# Patient Record
Sex: Female | Born: 1965 | Race: White | Hispanic: No | Smoking: Never smoker
Health system: Southern US, Community
[De-identification: ages and names within clinical notes are randomized; demographics above are authoritative.]

## PROBLEM LIST (undated history)

## (undated) HISTORY — PX: CHOLECYSTECTOMY: SHX55

---

## 2005-07-15 ENCOUNTER — Emergency Department: Payer: Self-pay | Admitting: Emergency Medicine

## 2005-07-15 ENCOUNTER — Ambulatory Visit: Payer: Self-pay | Admitting: Surgery

## 2005-07-15 ENCOUNTER — Ambulatory Visit: Payer: Self-pay | Admitting: Emergency Medicine

## 2009-03-31 ENCOUNTER — Emergency Department: Payer: Self-pay | Admitting: Emergency Medicine

## 2010-11-09 IMAGING — CR DG CHEST 2V
1 series · 2 of 2 positions shown · non-contrast
Comparison: none

REASON FOR EXAM: productive cough-flex6
COMMENTS:

[Series 1: view not recorded · 0.17mm/px · 2 of 2 slices shown]
[im 1/2]
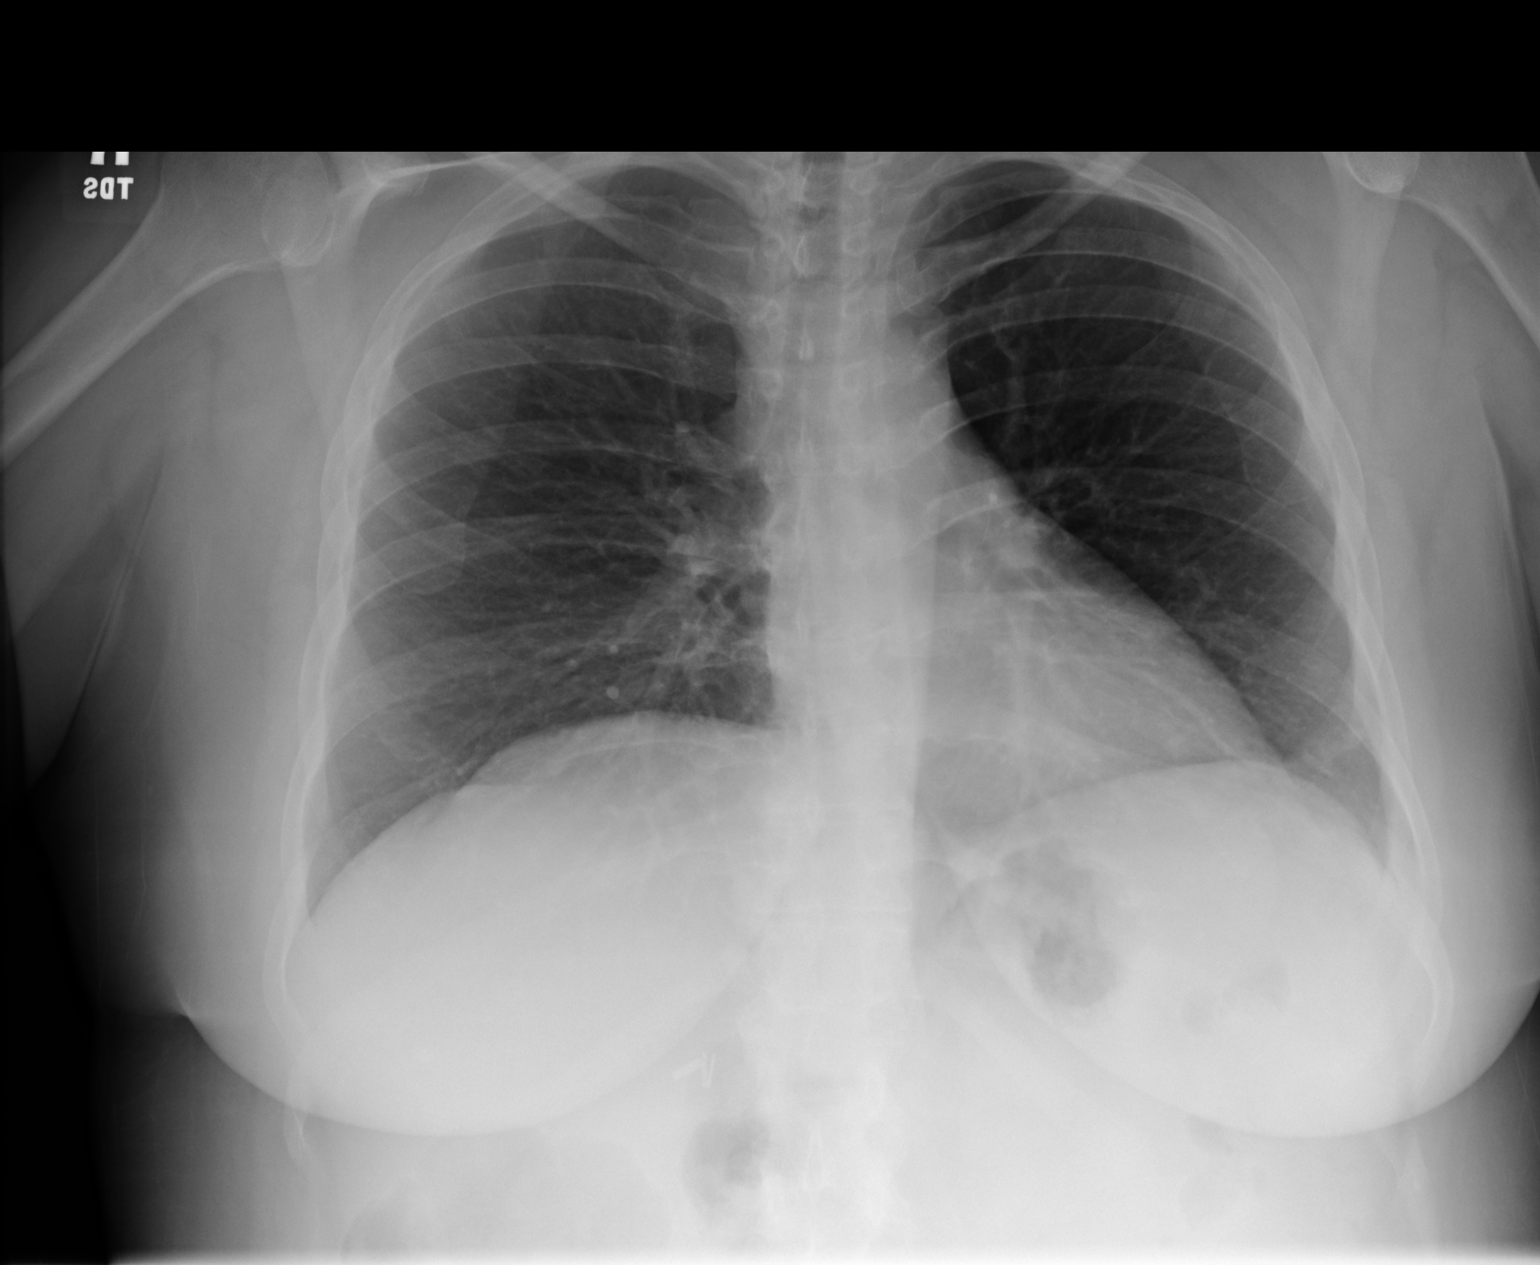
[im 2/2]
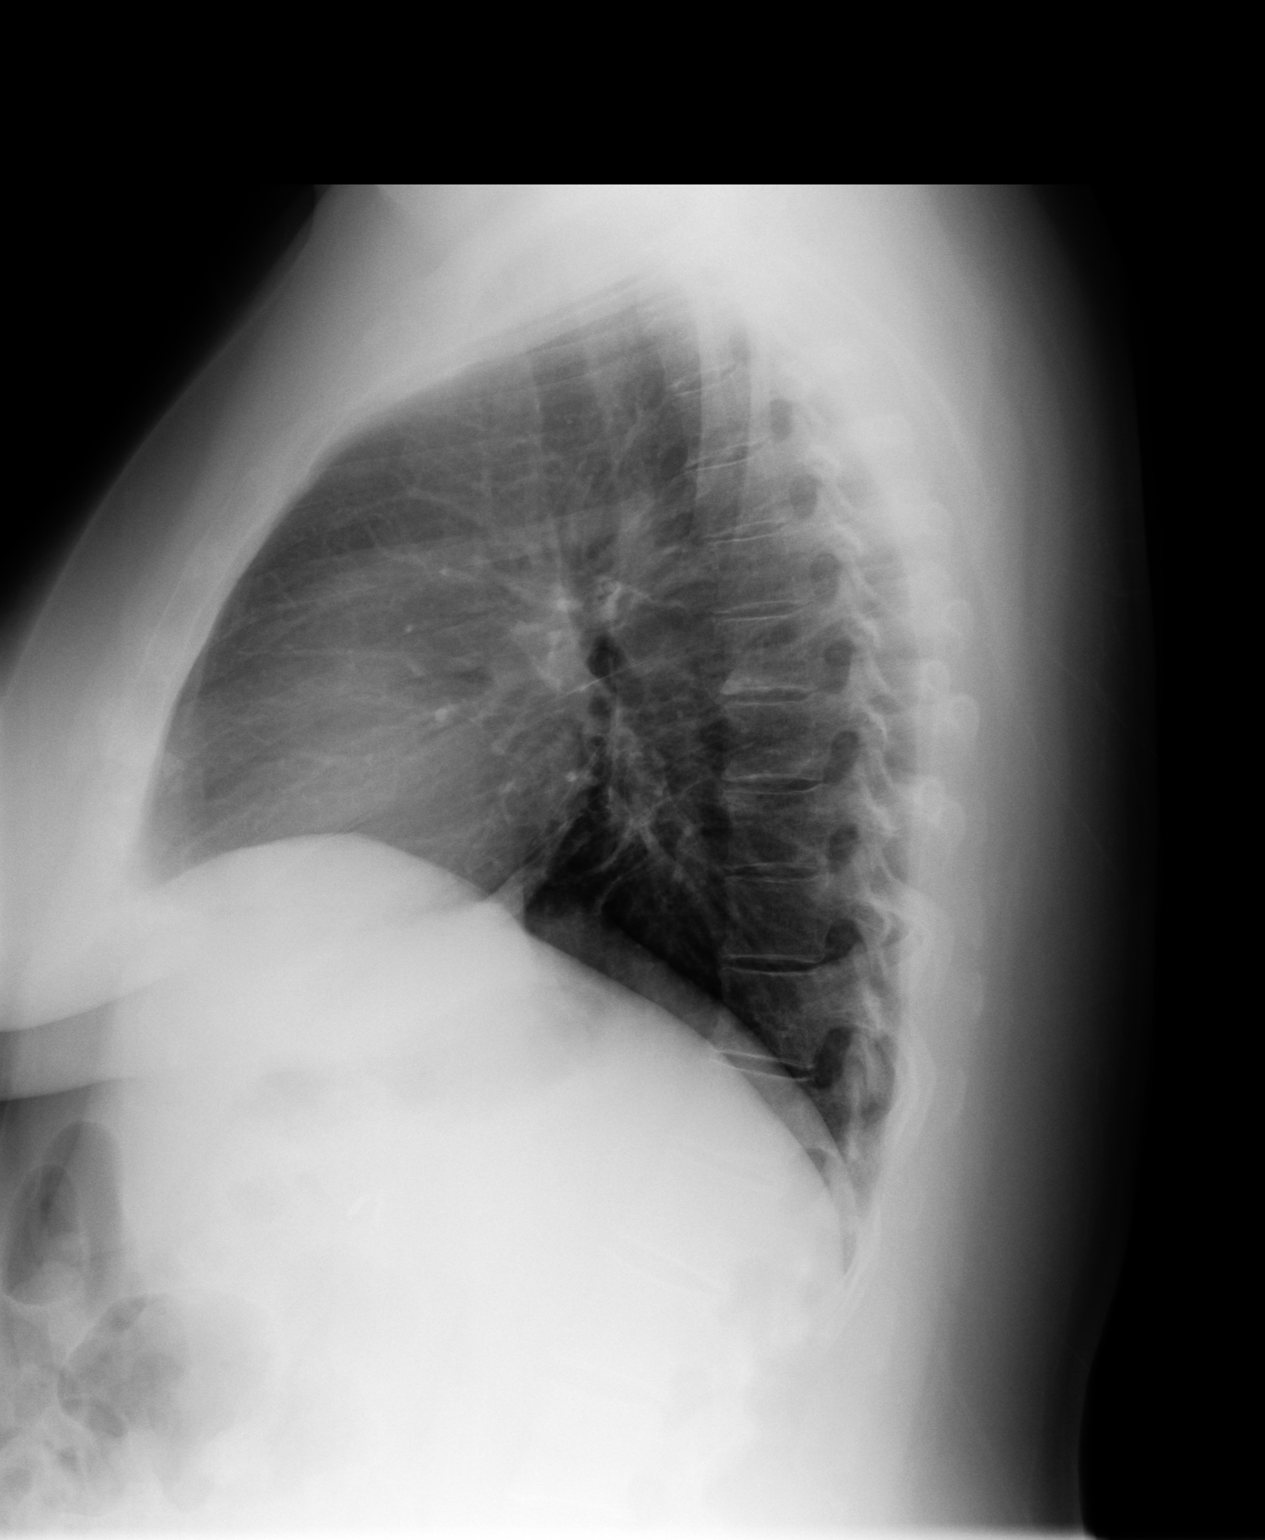

[2 of 2 positions shown; findings below may reference images not displayed]

PROCEDURE:     DXR - DXR CHEST PA (OR AP) AND LATERAL  - March 31, 2009 [DATE]

RESULT:     The lungs are mildly hypoinflated on the frontal film but are
better inflated on the lateral film. There is no focal pneumonia. The
cardiac silhouette is normal in size. The pulmonary vascularity does not
appear engorged.
IMPRESSION: I do not see evidence of pneumonia. I cannot exclude an
element of acute bronchitis in the appropriate clinical setting. Followup
films are recommended if the patient's cough persists despite therapy.

## 2013-07-02 LAB — CBC
HCT: 39.3 % (ref 35.0–47.0)
MCH: 26.6 pg (ref 26.0–34.0)
MCV: 78 fL — ABNORMAL LOW (ref 80–100)
Platelet: 193 10*3/uL (ref 150–440)
WBC: 10.5 10*3/uL (ref 3.6–11.0)

## 2013-07-02 LAB — COMPREHENSIVE METABOLIC PANEL
Alkaline Phosphatase: 76 U/L (ref 50–136)
Calcium, Total: 9.3 mg/dL (ref 8.5–10.1)
Chloride: 105 mmol/L (ref 98–107)
Co2: 27 mmol/L (ref 21–32)
Creatinine: 0.65 mg/dL (ref 0.60–1.30)
EGFR (African American): >60
EGFR (Non-African Amer.): >60
Glucose: 149 mg/dL — ABNORMAL HIGH (ref 65–99)
Osmolality: 276 (ref 275–301)
SGOT(AST): 48 U/L — ABNORMAL HIGH (ref 15–37)
Sodium: 137 mmol/L (ref 136–145)
Total Protein: 7.5 g/dL (ref 6.4–8.2)

## 2013-07-02 LAB — URINALYSIS, COMPLETE
Bilirubin,UR: NEGATIVE
Blood: NEGATIVE
Glucose,UR: NEGATIVE mg/dL (ref 0–75)
Ph: 5 (ref 4.5–8.0)
Protein: 30
Squamous Epithelial: 3

## 2013-07-02 LAB — DRUG SCREEN, URINE
Amphetamines, Ur Screen: NEGATIVE (ref ?–1000)
Barbiturates, Ur Screen: NEGATIVE (ref ?–200)
Cocaine Metabolite,Ur ~~LOC~~: NEGATIVE (ref ?–300)
MDMA (Ecstasy)Ur Screen: NEGATIVE (ref ?–500)
Methadone, Ur Screen: NEGATIVE (ref ?–300)
Opiate, Ur Screen: NEGATIVE (ref ?–300)

## 2013-07-02 LAB — ETHANOL: Ethanol: <3 mg/dL

## 2013-07-03 ENCOUNTER — Inpatient Hospital Stay: Payer: Self-pay | Admitting: Psychiatry

## 2015-03-09 NOTE — Discharge Summary (Signed)
PATIENT NAME:  Jean Gonzalez, Jean Gonzalez MR#:  [id number] DATE OF BIRTH:  Sep 15, 1966  DATE OF ADMISSION:  07/03/2013 DATE OF DISCHARGE:  07/08/2013  CHIEF COMPLAINT: The patient reported that she was really upset and was feeling unsafe and that she was suicidal. She had tried to hurt herself with a box cutter and has some superficial abrasions on her left arm.   HISTORY OF PRESENT ILLNESS: The patient is a 49 year old white female who presented to the Emergency Room on an IVC. She reported that she was very upset and was feeling unsafe. She was feeling quite depressed and had tried to cut herself. Her mother found out about her depression and recommended that she be admitted to the hospital and she was brought by her mom to the hospital. The patient reported that she had a lot of family stressors and also recently separated from her husband. She had a lot of family chaos and she has been unable to see her grandchildren as well. She has left her husband's house and had actually moved into her mother's house recently. The patient reports feeling very sad and depressed. She has been crying a lot. Has been feeling like her life is not worth living. 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 She lost her job since they ran out of business 2 weeks ago. The patient reports that she has been separated for the past 2 months after being married for 20 years. She is currently living with her parents. She is currently not on any medication or is she seeing a psychiatrist. She was once hospitalized back in 1996.   COURSE OF ILLNESS: The patient was admitted on usual precautions. She was able to contract for safety in the hospital. She was started initially on citalopram 40 mg which was reduced to 20 mg. Her trazodone was also titrated to 100 mg. Throughout her hospital stay, the patient gradually improved. Initially she was very isolated and was  tearful and would not participate in groups, but gradually she began to feel better, started sleeping better and eating better. She attended groups and was able to learn coping skills to deal with her situation. By the day of her discharge, the patient was mentally stable and was looking forward to being discharged. She did not have any suicidal or homicidal ideations.   MEDICAL HISTORY:  The patient denied any medical illnesses.   PAST PSYCHIATRIC HISTORY: As reported, this is the second inpatient admission and her previous admission was in 1996. She has not seen a psychiatrist until now.   FAMILY HISTORY OF MENTAL ILLNESS: One of her grandmothers had depression. One of her cousins committed suicide. One of her other grandmothers has bipolar disorder.   SOCIAL HISTORY: The patient has been married for more than 20 years and is recently separated. She has 2 children from her husband and 2 from a relationship.   ALCOHOL AND DRUGS: The patient denies drinking alcohol or using drugs.   ALLERGIES: PENICILLIN.   MENTAL STATUS EXAMINATION AT DISCHARGE: The patient was casually groomed in street clothes. Her affect was smiling and mood much improved. She was alert and oriented to place, person and time. She denied any suicidal or homicidal ideations. She was not having any auditory or visual hallucinations. Her insight and judgment have greatly improved and she is looking forward to being discharged.   DIAGNOSES: AXIS I: Major depressive disorder.  AXIS II: None.  AXIS III: None.  AXIS IV: Some financial and family stressors.  AXIS V: GAF of 75.   SUICIDE RISK ASSESSMENT: The patient is currently at minimal risk for suicide.   DISCHARGE PLAN: The patient will be discharged on Celexa at 20 mg and trazodone 100 mg once daily. She is encouraged to follow up with a therapist with whom she has been made appointments with. Follow up with a psychiatrist.  ____________________________ Patrick NorthHimabindu Danni Leabo,  MD hr:sb D: 07/12/2013 12:38:36 ET T: 07/12/2013 13:28:03 ET JOB#: [id number]  cc: Patrick NorthHimabindu Naia Ruff, MD, <Dictator> Patrick NorthHIMABINDU Sakiya Stepka MD ELECTRONICALLY SIGNED 07/15/2013 10:46
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
## 2017-09-28 ENCOUNTER — Other Ambulatory Visit: Payer: Self-pay

## 2017-09-28 ENCOUNTER — Emergency Department
Admission: EM | Admit: 2017-09-28 | Discharge: 2017-09-28 | Disposition: A | Discharge status: Home / Self Care | Payer: BLUE CROSS/BLUE SHIELD | Attending: Emergency Medicine | Admitting: Emergency Medicine

## 2017-09-28 DIAGNOSIS — N611 Abscess of the breast and nipple: Secondary | ICD-10-CM | POA: Insufficient documentation

## 2017-09-28 DIAGNOSIS — L0291 Cutaneous abscess, unspecified: Secondary | ICD-10-CM

## 2017-09-28 DIAGNOSIS — L0390 Cellulitis, unspecified: Secondary | ICD-10-CM

## 2017-09-28 LAB — CBC WITH DIFFERENTIAL/PLATELET
Basophils Absolute: 0 10*3/uL (ref 0–0.1)
Basophils Relative: 1 %
Eosinophils Absolute: 0.1 10*3/uL (ref 0–0.7)
Eosinophils Relative: 1 %
HCT: 39.5 % (ref 35.0–47.0)
Hemoglobin: 13.1 g/dL (ref 12.0–16.0)
LYMPHS PCT: 16 %
Lymphs Abs: 1.5 10*3/uL (ref 1.0–3.6)
MCH: 25.9 pg — AB (ref 26.0–34.0)
MCHC: 33.2 g/dL (ref 32.0–36.0)
MCV: 78.2 fL — AB (ref 80.0–100.0)
MONO ABS: 0.8 10*3/uL (ref 0.2–0.9)
Monocytes Relative: 9 %
NEUTROS ABS: 7 10*3/uL — AB (ref 1.4–6.5)
Neutrophils Relative %: 73 %
PLATELETS: 133 10*3/uL — AB (ref 150–440)
RBC: 5.05 MIL/uL (ref 3.80–5.20)
RDW: 16 % — ABNORMAL HIGH (ref 11.5–14.5)
WBC: 9.4 10*3/uL (ref 3.6–11.0)

## 2017-09-28 LAB — COMPREHENSIVE METABOLIC PANEL
ALBUMIN: 3.6 g/dL (ref 3.5–5.0)
ALT: 33 U/L (ref 14–54)
ANION GAP: 12 (ref 5–15)
AST: 28 U/L (ref 15–41)
Alkaline Phosphatase: 100 U/L (ref 38–126)
BILIRUBIN TOTAL: 0.7 mg/dL (ref 0.3–1.2)
BUN: 9 mg/dL (ref 6–20)
CO2: 24 mmol/L (ref 22–32)
Calcium: 9.2 mg/dL (ref 8.9–10.3)
Chloride: 94 mmol/L — ABNORMAL LOW (ref 101–111)
Creatinine, Ser: 0.47 mg/dL (ref 0.44–1.00)
GFR calc Af Amer: >60 mL/min (ref >60)
GFR calc non Af Amer: >60 mL/min (ref >60)
GLUCOSE: 245 mg/dL — AB (ref 65–99)
POTASSIUM: 3.6 mmol/L (ref 3.5–5.1)
SODIUM: 130 mmol/L — AB (ref 135–145)
TOTAL PROTEIN: 7.7 g/dL (ref 6.5–8.1)

## 2017-09-28 LAB — LACTIC ACID, PLASMA: LACTIC ACID, VENOUS: 1.2 mmol/L (ref 0.5–1.9)

## 2017-09-28 MED ORDER — SULFAMETHOXAZOLE-TRIMETHOPRIM 800-160 MG PO TABS: 1.0000 | ORAL_TABLET | Freq: Two times a day (BID) | ORAL | 0 refills | Status: AC

## 2017-09-28 MED ORDER — ACETAMINOPHEN 325 MG PO TABS
650.0000 mg | ORAL_TABLET | Freq: Once | ORAL | Status: AC | PRN
  Administered 2017-09-28: 650 mg via ORAL

## 2017-09-28 MED ORDER — ACETAMINOPHEN 325 MG PO TABS
ORAL_TABLET | ORAL | Status: AC
  Filled 2017-09-28: qty 2

## 2017-09-28 MED ORDER — LIDOCAINE HCL (PF) 1 % IJ SOLN
5.0000 mL | Freq: Once | INTRAMUSCULAR | Status: AC
  Administered 2017-09-28: 5 mL
  Filled 2017-09-28: qty 5

## 2017-09-28 NOTE — ED Triage Notes (Signed)
Pt c/o pain and redness under left breast since Thursday. Pt feels throbbing to area. Fever, chills. Redness spreading across LU abdomen.

## 2017-09-28 NOTE — ED Notes (Signed)
Patient has area of redness under left breast that is slightly swollen and very painful and tender to touch.  Patient denies any other complaint at this time.

## 2017-09-28 NOTE — ED Notes (Signed)
Dr. Derrill KayGoodman in room to drain abscess.  This RN at bedside to assist and witness.  Patient tolerated procedure well.  Will continue to monitor.

## 2017-09-28 NOTE — ED Provider Notes (Signed)
[facility] Emergency Department Provider Note   ____________________________________________   I have reviewed the triage vital signs and the nursing notes.   HISTORY  Chief Complaint Other (Skin Infection)   History limited by: Not Limited   HPI Jean Gonzalez is a 51 y.o. female who presents to the emergency department today because of concern for redness under the left breast.   LOCATION:left breast DURATION:3 days TIMING: constant QUALITY: redness CONTEXT: patient denies any trauma.  ASSOCIATED SYMPTOMS: has fevers and chills.   Per medical record review patient has no pertinent history.  History reviewed. No pertinent past medical history.  There are no active problems to display for this patient.   History reviewed. No pertinent surgical history.  Prior to Admission medications   Not on File    Allergies Patient has no known allergies.  No family history on file.  Social History Social History   Tobacco Use  . Smoking status: Not on file  Substance Use Topics  . Alcohol use: No    Frequency: Never  . Drug use: Not on file    Review of Systems Constitutional: No fever/chills Eyes: No visual changes. ENT: No sore throat. Cardiovascular: Denies chest pain. Respiratory: Denies shortness of breath. Gastrointestinal: No abdominal pain.  No nausea, no vomiting.  No diarrhea.   Genitourinary: Negative for dysuria. Musculoskeletal: Negative for back pain. Skin: Positive for redness under left breast.  Neurological: Negative for headaches, focal weakness or numbness.  ____________________________________________   PHYSICAL EXAM:  VITAL SIGNS: ED Triage Vitals  Enc Vitals Group     BP 09/28/17 0951 (!) 140/94     Pulse Rate 09/28/17 0951 90     Resp 09/28/17 0951 18     Temp 09/28/17 0951 (!) 101.1 F (38.4 C)     Temp Source 09/28/17 0951 Oral     SpO2 09/28/17 0951 99 %     Weight 09/28/17 0952 185 lb (83.9 kg)      Height 09/28/17 0952 4\' 11"  (1.499 m)     Head Circumference --      Peak Flow --      Pain Score 09/28/17 0951 10   Constitutional: Alert and oriented. Well appearing and in no distress. Eyes: Conjunctivae are normal.  ENT   Head: Normocephalic and atraumatic.   Nose: No congestion/rhinnorhea.   Mouth/Throat: Mucous membranes are moist.   Neck: No stridor. Hematological/Lymphatic/Immunilogical: No cervical lymphadenopathy. Cardiovascular: Normal rate, regular rhythm.  No murmurs, rubs, or gallops.  Respiratory: Normal respiratory effort without tachypnea nor retractions. Breath sounds are clear and equal bilaterally. No wheezes/rales/rhonchi. Gastrointestinal: Soft and non tender. No rebound. No guarding.  Genitourinary: Deferred Musculoskeletal: Normal range of motion in all extremities. No lower extremity edema. Neurologic:  Normal speech and language. No gross focal neurologic deficits are appreciated.  Skin:  Area of erythema under left breast, also with some fluctuance.  Psychiatric: Mood and affect are normal. Speech and behavior are normal. Patient exhibits appropriate insight and judgment.  ____________________________________________    LABS (pertinent positives/negatives)  Lactic 1.2 CBC wbc 9.4, hgb 13.1 CMP na 130, glu 245  ____________________________________________   EKG   None  ____________________________________________    RADIOLOGY  None  ____________________________________________   PROCEDURES  Procedures  Incision and Drainage of Abcess Location: left lower chest Anesthesia Local: 1% Lidocaine without Epi  Prep/Procedure: Skin Prep: Chlorahex Incised abscess with #11 blade Purulent discharge: large Probed to break up loculations Packed with 1/4" gauze Estimated blood  loss: 2 ml  ____________________________________________   INITIAL IMPRESSION / ASSESSMENT AND PLAN / ED COURSE  Pertinent labs & imaging  results that were available during my care of the patient were reviewed by me and considered in my medical decision making (see chart for details).  Patient presented to the emergency department today because of a redness in her left breast.  Bedside ultrasound does show a pocket of pus under the surface.  This is consistent with abscess and cellulitis.  I&D was performed with a large amount of pus.  Given surrounding cellulitis will place patient on antibiotics.  Discussed plan with patient.   ____________________________________________   FINAL CLINICAL IMPRESSION(S) / ED DIAGNOSES  Final diagnoses:  Abscess  Cellulitis, unspecified cellulitis site     Note: This dictation was prepared with Dragon dictation. Any transcriptional errors that result from this process are unintentional     Phineas SemenGoodman, Evoleht Hovatter, MD 09/28/17 1530

## 2017-09-28 NOTE — ED Notes (Signed)
Assisted Dr. Derrill KayGoodman with skin exam.

## 2017-09-28 NOTE — Discharge Instructions (Signed)
Please seek medical attention for any high fevers, chest pain, shortness of breath, change in behavior, persistent vomiting, bloody stool or any other new or concerning symptoms.  

## 2019-01-31 ENCOUNTER — Other Ambulatory Visit: Payer: Self-pay

## 2019-01-31 ENCOUNTER — Encounter: Payer: Self-pay | Admitting: Emergency Medicine

## 2019-01-31 ENCOUNTER — Emergency Department
Admission: EM | Admit: 2019-01-31 | Discharge: 2019-01-31 | Disposition: A | Discharge status: Home / Self Care | Payer: BLUE CROSS/BLUE SHIELD | Attending: Emergency Medicine | Admitting: Emergency Medicine

## 2019-01-31 DIAGNOSIS — R69 Illness, unspecified: Secondary | ICD-10-CM

## 2019-01-31 DIAGNOSIS — J111 Influenza due to unidentified influenza virus with other respiratory manifestations: Secondary | ICD-10-CM | POA: Insufficient documentation

## 2019-01-31 LAB — INFLUENZA PANEL BY PCR (TYPE A & B)
INFLAPCR: NEGATIVE
Influenza B By PCR: NEGATIVE

## 2019-01-31 MED ORDER — BENZONATATE 100 MG PO CAPS: 200.0000 mg | ORAL_CAPSULE | Freq: Three times a day (TID) | ORAL | 0 refills | Status: AC | PRN

## 2019-01-31 MED ORDER — IBUPROFEN 600 MG PO TABS: 600.0000 mg | ORAL_TABLET | Freq: Three times a day (TID) | ORAL | 0 refills | Status: AC | PRN

## 2019-01-31 MED ORDER — FEXOFENADINE-PSEUDOEPHED ER 60-120 MG PO TB12: 1.0000 | ORAL_TABLET | Freq: Two times a day (BID) | ORAL | 0 refills | Status: AC

## 2019-01-31 NOTE — ED Triage Notes (Signed)
Onset yesterday body aches , headache, unsure if she had fever.

## 2019-01-31 NOTE — ED Notes (Signed)
See triage note  Presents with body/muscle aches for couple of days also having sore throat and headache  Unsure of fever but is afebrile on arrival

## 2019-01-31 NOTE — ED Provider Notes (Signed)
[facility] Emergency Department Provider Note   ____________________________________________   First MD Initiated Contact with Patient 01/31/19 1048     (approximate)  I have reviewed the triage vital signs and the nursing notes.   HISTORY  Chief Complaint Generalized Body Aches and Headache    HPI Jean Gonzalez is a 53 y.o. female patient state acute onset of body aches, headache, but unsure of fever.  Patient states symptoms started last night and was worse with a.m. awakening.  Patient denies nausea, vomiting, diarrhea.  Patient had taken flu shot for this season.  Patient rates the pain is 8/10.  Patient described the pain is "achy".  No palliative measure for complaint.         History reviewed. No pertinent past medical history.  There are no active problems to display for this patient.   Past Surgical History:  Procedure Laterality Date  . CESAREAN SECTION    . CHOLECYSTECTOMY      Prior to Admission medications   Medication Sig Start Date End Date Taking? Authorizing Provider  benzonatate (TESSALON PERLES) 100 MG capsule Take 2 capsules (200 mg total) by mouth 3 (three) times daily as needed. 01/31/19 01/31/20  Joni Reining, PA-C  fexofenadine-pseudoephedrine (ALLEGRA-D) 60-120 MG 12 hr tablet Take 1 tablet by mouth 2 (two) times daily. 01/31/19   Joni Reining, PA-C  ibuprofen (ADVIL,MOTRIN) 600 MG tablet Take 1 tablet (600 mg total) by mouth every 8 (eight) hours as needed. 01/31/19   Joni Reining, PA-C    Allergies Patient has no known allergies.  No family history on file.  Social History Social History   Tobacco Use  . Smoking status: Never Smoker  . Smokeless tobacco: Never Used  Substance Use Topics  . Alcohol use: No    Frequency: Never  . Drug use: Not on file    Review of Systems Constitutional: No fever/chills.  Body aches. Eyes: No visual changes. ENT nasal congestion and sore throat.   Cardiovascular:  Denies chest pain. Respiratory: Denies shortness of breath. Gastrointestinal: No abdominal pain.  No nausea, no vomiting.  No diarrhea.  No constipation. Genitourinary: Negative for dysuria. Musculoskeletal: Negative for back pain. Skin: Negative for rash. Neurological: Negative for headaches, focal weakness or numbness.   ____________________________________________   PHYSICAL EXAM:  VITAL SIGNS: ED Triage Vitals [01/31/19 1014]  Enc Vitals Group     BP (!) 157/75     Pulse Rate 74     Resp 16     Temp 98 F (36.7 C)     Temp Source Oral     SpO2 98 %     Weight 187 lb (84.8 kg)     Height 4\' 11"  (1.499 m)     Head Circumference      Peak Flow      Pain Score 8     Pain Loc      Pain Edu?      Excl. in GC?    Constitutional: Alert and oriented. Well appearing and in no acute distress. Mouth/Throat: Mucous membranes are moist.  Oropharynx non-erythematous.  Postnasal drainage. Neck: No stridor.   Hematological/Lymphatic/Immunilogical: No cervical lymphadenopathy. Cardiovascular: Normal rate, regular rhythm. Grossly normal heart sounds.  Good peripheral circulation. Respiratory: Normal respiratory effort.  No retractions. Lungs CTAB. Gastrointestinal: Soft and nontender. No distention. No abdominal bruits. No CVA tenderness. Musculoskeletal: No lower extremity tenderness nor edema.  No joint effusions. Neurologic:  Normal speech and language. No  gross focal neurologic deficits are appreciated. No gait instability. Skin:  Skin is warm, dry and intact. No rash noted. Psychiatric: Mood and affect are normal. Speech and behavior are normal.  ____________________________________________   LABS (all labs ordered are listed, but only abnormal results are displayed)  Labs Reviewed  INFLUENZA PANEL BY PCR (TYPE A & B)   ____________________________________________  EKG   ____________________________________________  RADIOLOGY  ED MD interpretation:    Official  radiology report(s): No results found.  ____________________________________________   PROCEDURES  Procedure(s) performed (including Critical Care):  Procedures   ____________________________________________   INITIAL IMPRESSION / ASSESSMENT AND PLAN / ED COURSE  As part of my medical decision making, I reviewed the following data within the electronic MEDICAL RECORD NUMBER      Patient presents with onset of body aches headache and cough.  Patient states she is unsure of fever.  Patient flu results were negative.  Patient physical exam consistent with viral respiratory illness.  Patient given discharge care instruction advised take medication as directed.  Patient advised follow-up [facility] clinic.        ____________________________________________   FINAL CLINICAL IMPRESSION(S) / ED DIAGNOSES  Final diagnoses:  Influenza-like illness     ED Discharge Orders         Ordered    fexofenadine-pseudoephedrine (ALLEGRA-D) 60-120 MG 12 hr tablet  2 times daily     01/31/19 1139    benzonatate (TESSALON PERLES) 100 MG capsule  3 times daily PRN     01/31/19 1139    ibuprofen (ADVIL,MOTRIN) 600 MG tablet  Every 8 hours PRN     01/31/19 1139           Note:  This document was prepared using Dragon voice recognition software and may include unintentional dictation errors.    Joni Reining, PA-C 01/31/19 1142    Don Perking, [place], MD 02/01/19 [phone]

## 2019-02-01 ENCOUNTER — Telehealth: Payer: Self-pay | Admitting: Physician Assistant

## 2019-02-01 DIAGNOSIS — R0981 Nasal congestion: Secondary | ICD-10-CM

## 2019-02-01 DIAGNOSIS — J029 Acute pharyngitis, unspecified: Secondary | ICD-10-CM

## 2019-02-01 DIAGNOSIS — R059 Cough, unspecified: Secondary | ICD-10-CM

## 2019-02-01 DIAGNOSIS — Z711 Person with feared health complaint in whom no diagnosis is made: Secondary | ICD-10-CM

## 2019-02-01 DIAGNOSIS — R05 Cough: Secondary | ICD-10-CM

## 2019-02-01 NOTE — Progress Notes (Signed)
E-Visit for Corona Virus Screening Based on your current symptoms, it seems unlikely that your symptoms are related to the [place] virus.   Coronavirus disease 2019 (COVID-19) is a respiratory illness that can spread from person to person. The virus that causes COVID-19 is a new virus that was first identified in the country of [place] but is now found in multiple other countries and has spread to the [place].  Symptoms associated with the virus are mild to severe fever, cough, and shortness of breath. There is currently no vaccine to protect against COVID-19, and there is no specific antiviral treatment for the virus.  It is vitally important that if you feel that you have an infection such as this virus or any other virus that you stay home and away from places where you may spread it to others.  Currently, not all patients are not being tested. If the symptoms are mild and there is not a known exposure, performing the test is not indicated.  You can use medication such as A prescription inhaler called Albuterol MDI 90 mcg /actuation 2 puffs every 4 hours as needed for shortness of breath, wheezing, cough  Reduce your risk of any infection by using the same precautions used for avoiding the common cold or flu:  [place] Kitchen Wash your hands often with soap and warm water for at least 20 seconds.  If soap and water are not readily available, use an alcohol-based hand sanitizer with at least 60% alcohol.  . If coughing or sneezing, cover your mouth and nose by coughing or sneezing into the elbow areas of your shirt or coat, into a tissue or into your sleeve (not your hands). . Avoid shaking hands with others and consider head nods or verbal greetings only. . Avoid touching your eyes, nose, or mouth with unwashed hands.  . Avoid close contact with people who are sick. . If you have some symptoms but not all symptoms, continue to monitor your condition at home . If your symptoms worsen over the next 48  hours seek medical attention through the Emergency Department . If you are having a medical emergency, call 911.  HOME CARE . Only take medications as instructed by your medical team. . Drink plenty of fluids and get plenty of rest. . A steam or ultrasonic humidifier can help if you have congestion.   GET HELP RIGHT AWAY IF: . You develop worsening fever. . You become short of breath . You cough up blood. . Your symptoms become more severe MAKE SURE YOU   Understand these instructions.  Will watch your condition.  Will get help right away if you are not doing well or get worse.  Your e-visit answers were reviewed by a board certified advanced clinical practitioner to complete your personal care plan.  Depending on the condition, your plan could have included both over the counter or prescription medications.  If there is a problem please reply once you have received a response from your provider. Your safety is important to [place].  If you have drug allergies check your prescription carefully.    You can use MyChart to ask questions about today's visit, request a non-urgent call back, or ask for a work or school excuse for 24 hours related to this e-Visit. If it has been greater than 24 hours you will need to follow up with your provider, or enter a new e-Visit to address those concerns. You will get an e-mail in the next two  days asking about your experience.  I hope that your e-visit has been valuable and will speed your recovery. Thank you for using e-visits.
